# Patient Record
Sex: Male | Born: 1980 | Race: Black or African American | Hispanic: No | Marital: Single | Smoking: Never smoker
Health system: Southern US, Community
[De-identification: ages and names within clinical notes are randomized; demographics above are authoritative.]

---

## 2003-07-30 ENCOUNTER — Emergency Department (HOSPITAL_COMMUNITY): Admission: AD | Admit: 2003-07-30 | Discharge: 2003-07-30 | Payer: Self-pay | Admitting: Family Medicine

## 2018-11-07 ENCOUNTER — Emergency Department
Admission: EM | Admit: 2018-11-07 | Discharge: 2018-11-07 | Disposition: A | Discharge status: Home / Self Care | Payer: BC Managed Care – PPO | Attending: Emergency Medicine | Admitting: Emergency Medicine

## 2018-11-07 ENCOUNTER — Emergency Department: Payer: BC Managed Care – PPO

## 2018-11-07 ENCOUNTER — Other Ambulatory Visit: Payer: Self-pay

## 2018-11-07 ENCOUNTER — Encounter: Payer: Self-pay | Admitting: Emergency Medicine

## 2018-11-07 DIAGNOSIS — Y92410 Unspecified street and highway as the place of occurrence of the external cause: Secondary | ICD-10-CM | POA: Insufficient documentation

## 2018-11-07 DIAGNOSIS — Y9389 Activity, other specified: Secondary | ICD-10-CM | POA: Insufficient documentation

## 2018-11-07 DIAGNOSIS — Y998 Other external cause status: Secondary | ICD-10-CM | POA: Insufficient documentation

## 2018-11-07 DIAGNOSIS — S161XXA Strain of muscle, fascia and tendon at neck level, initial encounter: Secondary | ICD-10-CM

## 2018-11-07 DIAGNOSIS — S199XXA Unspecified injury of neck, initial encounter: Secondary | ICD-10-CM | POA: Diagnosis present

## 2018-11-07 MED ORDER — BACLOFEN 10 MG PO TABS: 10.0000 mg | ORAL_TABLET | Freq: Three times a day (TID) | ORAL | 1 refills | Status: AC

## 2018-11-07 MED ORDER — MELOXICAM 15 MG PO TABS: 15.0000 mg | ORAL_TABLET | Freq: Every day | ORAL | 0 refills | Status: AC
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
## 2018-11-07 NOTE — Discharge Instructions (Addendum)
Follow-up with your regular doctor if not better in 5 to 7 days or he can see orthopedics.  You may need physical therapy if you have not improved within 4 to 5 days.  Take medications as prescribed.  Apply ice to all areas that hurt.  Return emergency department worsening.
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
## 2019-12-25 ENCOUNTER — Ambulatory Visit: Payer: BC Managed Care – PPO | Attending: Internal Medicine

## 2019-12-25 DIAGNOSIS — Z23 Encounter for immunization: Secondary | ICD-10-CM

## 2019-12-25 NOTE — Progress Notes (Signed)
   Covid-19 Vaccination Clinic  Name:  Benjamin Ewing    MRN: [id number] DOB: 19-Dec-1980  12/25/2019  Benjamin Ewing was observed post Covid-19 immunization for 15 minutes without incident. He was provided with Vaccine Information Sheet and instruction to access the V-Safe system.   Benjamin Ewing was instructed to call 911 with any severe reactions post vaccine: Marland Kitchen Difficulty breathing  . Swelling of face and throat  . A fast heartbeat  . A bad rash all over body  . Dizziness and weakness   Immunizations Administered    Name Date Dose VIS Date Route   Pfizer COVID-19 Vaccine 12/25/2019  4:44 PM 0.3 mL 09/11/2019 Intramuscular   Manufacturer: [employer], [employer]   Lot: [id number]   NDC: [address]-7

## 2020-01-13 ENCOUNTER — Ambulatory Visit: Payer: BC Managed Care – PPO | Admitting: Primary Care

## 2020-01-13 DIAGNOSIS — Z0289 Encounter for other administrative examinations: Secondary | ICD-10-CM

## 2020-01-15 ENCOUNTER — Ambulatory Visit: Payer: BC Managed Care – PPO | Attending: Internal Medicine

## 2020-01-15 DIAGNOSIS — Z23 Encounter for immunization: Secondary | ICD-10-CM

## 2020-01-15 NOTE — Progress Notes (Signed)
   Covid-19 Vaccination Clinic  Name:  Benjamin Ewing    MRN: [id number] DOB: Jul 17, 1981  01/15/2020  [name] was observed post Covid-19 immunization for 15 minutes without incident. He was provided with Vaccine Information Sheet and instruction to access the V-Safe system.   [name] was instructed to call 911 with any severe reactions post vaccine: Marland Kitchen Difficulty breathing  . Swelling of face and throat  . A fast heartbeat  . A bad rash all over body  . Dizziness and weakness   Immunizations Administered    Name Date Dose VIS Date Route   Pfizer COVID-19 Vaccine 01/15/2020  4:34 PM 0.3 mL 09/11/2019 Intramuscular   Manufacturer: [employer], [employer]   Lot: [id number]   NDC: [address]-2
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
## 2021-08-08 ENCOUNTER — Encounter: Payer: Self-pay | Admitting: *Deleted
